# Patient Record
Sex: Female | Born: 1955 | Race: White | Hispanic: No | Marital: Married | Smoking: Never smoker
Health system: Southern US, Community
[De-identification: ages and names within clinical notes are randomized; demographics above are authoritative.]

## PROBLEM LIST (undated history)

## (undated) DIAGNOSIS — E785 Hyperlipidemia, unspecified: Secondary | ICD-10-CM

---

## 1999-08-04 ENCOUNTER — Ambulatory Visit (HOSPITAL_COMMUNITY): Admission: RE | Admit: 1999-08-04 | Discharge: 1999-08-04 | Payer: Self-pay | Admitting: Obstetrics and Gynecology

## 1999-08-04 ENCOUNTER — Encounter (INDEPENDENT_AMBULATORY_CARE_PROVIDER_SITE_OTHER): Payer: Self-pay

## 2009-02-17 ENCOUNTER — Other Ambulatory Visit: Admission: RE | Admit: 2009-02-17 | Discharge: 2009-02-17 | Payer: Self-pay | Admitting: Family Medicine

## 2009-02-25 ENCOUNTER — Encounter: Admission: RE | Admit: 2009-02-25 | Discharge: 2009-02-25 | Payer: Self-pay | Admitting: Family Medicine

## 2010-03-08 ENCOUNTER — Other Ambulatory Visit: Admission: RE | Admit: 2010-03-08 | Discharge: 2010-03-08 | Payer: Self-pay | Admitting: Family Medicine

## 2010-03-24 ENCOUNTER — Encounter: Admission: RE | Admit: 2010-03-24 | Discharge: 2010-03-24 | Payer: Self-pay | Admitting: Family Medicine

## 2011-01-06 NOTE — Op Note (Signed)
[facility]  Patient:    Mary Patel                       MRN: [id number] Proc. Date: 08/04/99 Adm. Date:  [id number] Attending:  Lendon Colonel                           Operative Report  PREOPERATIVE DIAGNOSIS:       Persistent abnormal uterine bleeding.  POSTOPERATIVE DIAGNOSIS:      Persistent abnormal uterine bleeding.  OPERATION:                    Hysteroscopy with resection.  SURGEON:                      Katherine Roan, M.D.  ANESTHESIA:  DESCRIPTION OF PROCEDURE:     The patient was placed in the lithotomy position.  The osmotic dilator was removed from the vagina.  The cervix was dilated quite easily and the hysteroscope was inserted.  Visualization of the pelvis and visualization of the inside of the uterine cavity revealed a fairly smooth uterine cavity with an irregularity posteriorly.  The entire endometrial cavity was resected with a resectoscope and all of the resected material was sent to the lab for study.                                The patient tolerated this procedure well and was  went to the recovery room in good condition. DD:  08/04/99 TD:  08/05/99 Job: [id number]

## 2011-03-28 ENCOUNTER — Other Ambulatory Visit: Payer: Self-pay | Admitting: Family Medicine

## 2011-03-28 DIAGNOSIS — Z1231 Encounter for screening mammogram for malignant neoplasm of breast: Secondary | ICD-10-CM

## 2011-04-04 ENCOUNTER — Ambulatory Visit
Admission: RE | Admit: 2011-04-04 | Discharge: 2011-04-04 | Disposition: A | Discharge status: Home / Self Care | Payer: BC Managed Care – PPO | Source: Ambulatory Visit | Attending: Family Medicine | Admitting: Family Medicine

## 2011-04-04 DIAGNOSIS — Z1231 Encounter for screening mammogram for malignant neoplasm of breast: Secondary | ICD-10-CM

## 2011-04-06 ENCOUNTER — Encounter: Payer: Self-pay | Admitting: Sports Medicine

## 2011-04-06 ENCOUNTER — Ambulatory Visit (INDEPENDENT_AMBULATORY_CARE_PROVIDER_SITE_OTHER): Payer: BC Managed Care – PPO | Admitting: Sports Medicine

## 2011-04-06 VITALS — BP 123/81 | HR 85 | Ht 67.0 in | Wt 180.0 lb

## 2011-04-06 DIAGNOSIS — R269 Unspecified abnormalities of gait and mobility: Secondary | ICD-10-CM

## 2011-04-06 DIAGNOSIS — IMO0002 Reserved for concepts with insufficient information to code with codable children: Secondary | ICD-10-CM

## 2011-04-06 DIAGNOSIS — M179 Osteoarthritis of knee, unspecified: Secondary | ICD-10-CM | POA: Insufficient documentation

## 2011-04-06 DIAGNOSIS — M25569 Pain in unspecified knee: Secondary | ICD-10-CM

## 2011-04-06 DIAGNOSIS — M1710 Unilateral primary osteoarthritis, unspecified knee: Secondary | ICD-10-CM

## 2011-04-06 NOTE — Assessment & Plan Note (Signed)
She is doing a good exercise program at this time and I encouraged her to continue that.  Biking is particularly helpful and she will do this as weather allows.  As she finishes the water aerobics class she is going to look for a little but she can use for the winter to keep this activity as well. Note that her strep testing was actually quite good so I think she is making good progress.  She will return to Dr. Clelia Croft and I will see her as needed.

## 2011-04-06 NOTE — Progress Notes (Signed)
  Subjective:    Patient ID: Mary Patel, female    DOB: 1956-04-07, 55 y.o.   MRN: [id number]  HPI Very pleasant lady sent for my evaluation courtesy of Dr. Philipp Deputy  This patient had had several visits to Dr. Bard Herbert for evaluation of left knee pain. Standing x-rays of both knees were unremarkable. An MRI of her left knee done in a show some mild arthritis in 3 compartments but nothing significant. The patient has tried to make significant lifestyle changes over the past couple of years. Since going through menopause she has been on a lot more weight and then noticed those more swelling of both legs and more left knee pain. Recently she was placed on etodolac which has done a nice job of relieving her knee pain and also does not bother her stomach. She is generally doing better having completed the class of water aerobics. She was able to go to hanging rod and height both the upper and lower waterfalls which have a lot of steep steps and she did not get significant knee swelling after that.  Dr. Clelia Croft felt she might benefit from some evaluation of her gait as the patient has always tended to pronate while walking   Review of Systems     Objective:   Physical Exam Pleasant and in no acute distress  RT Knee: Normal to inspection with no erythema or effusion or obvious bony abnormalities. Palpation normal with no warmth or joint line tenderness or patellar tenderness or condyle tenderness. ROM normal in flexion and extension and lower leg rotation. Ligaments with solid consistent endpoints including ACL, PCL, LCL, MCL. Negative Mcmurray's and provocative meniscal tests. Non painful patellar compression. But mild crepitation Patellar and quadriceps tendons unremarkable. Hamstring and quadriceps strength is normal.  LT knee Normal to inspection with no erythema or effusion or obvious bony abnormalities. Palpation normal with no warmth or joint line tenderness or patellar tenderness  or condyle tenderness. ROM normal in flexion and extension and lower leg rotation. Ligaments with solid consistent endpoints including ACL, PCL, LCL, MCL. Negative Mcmurray's and provocative meniscal tests. Mildy painful patellar compression and moderate creptiation Patellar and quadriceps tendons unremarkable. Hamstring and quadriceps strength is normal.  Staining shows loss of her longitudinal arch bilaterally Walking shows that she gets dynamic genu valgus bilaterally  Her walking was significantly improved with the addition of sports insoles and arch pads  She does have a number of superficial varicosities and generalized swelling of both legs that may represent some venous insufficiency. There was no knee effusion associated with this.         Assessment & Plan:

## 2011-04-06 NOTE — Assessment & Plan Note (Signed)
Continue to correct this by using sports insoles with arch pads  If these do not give him enough support we can always decide to do custom orthotics at a later date

## 2011-04-06 NOTE — Assessment & Plan Note (Signed)
This is well controlled with her current medication and did not suggest making any change

## 2011-04-17 IMAGING — MG MM DIGITAL SCREENING
4 series · 4 of 4 positions shown · non-contrast
Comparison: none

DG SCREEN MAMMOGRAM BILATERAL
Bilateral CC and MLO view(s) were taken.
Technologist: John Mckay Montesin

DIGITAL SCREENING MAMMOGRAM WITH CAD:
The breast tissue is heterogeneously dense.  No masses or malignant type calcifications are 
identified.  Compared with prior studies.
Images were processed with CAD.

[R CC]
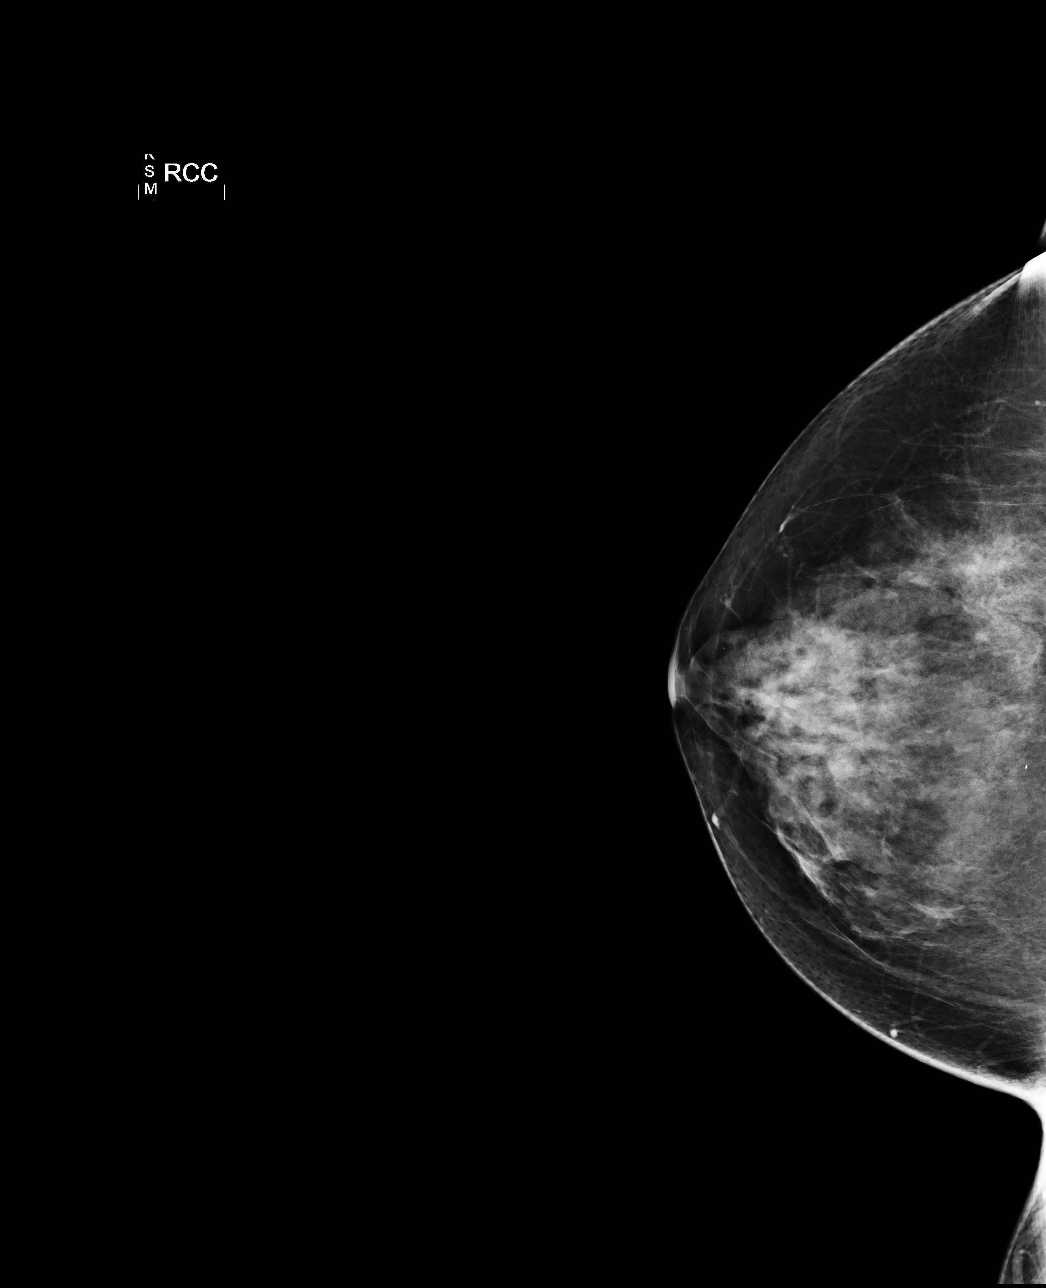

[L CC]
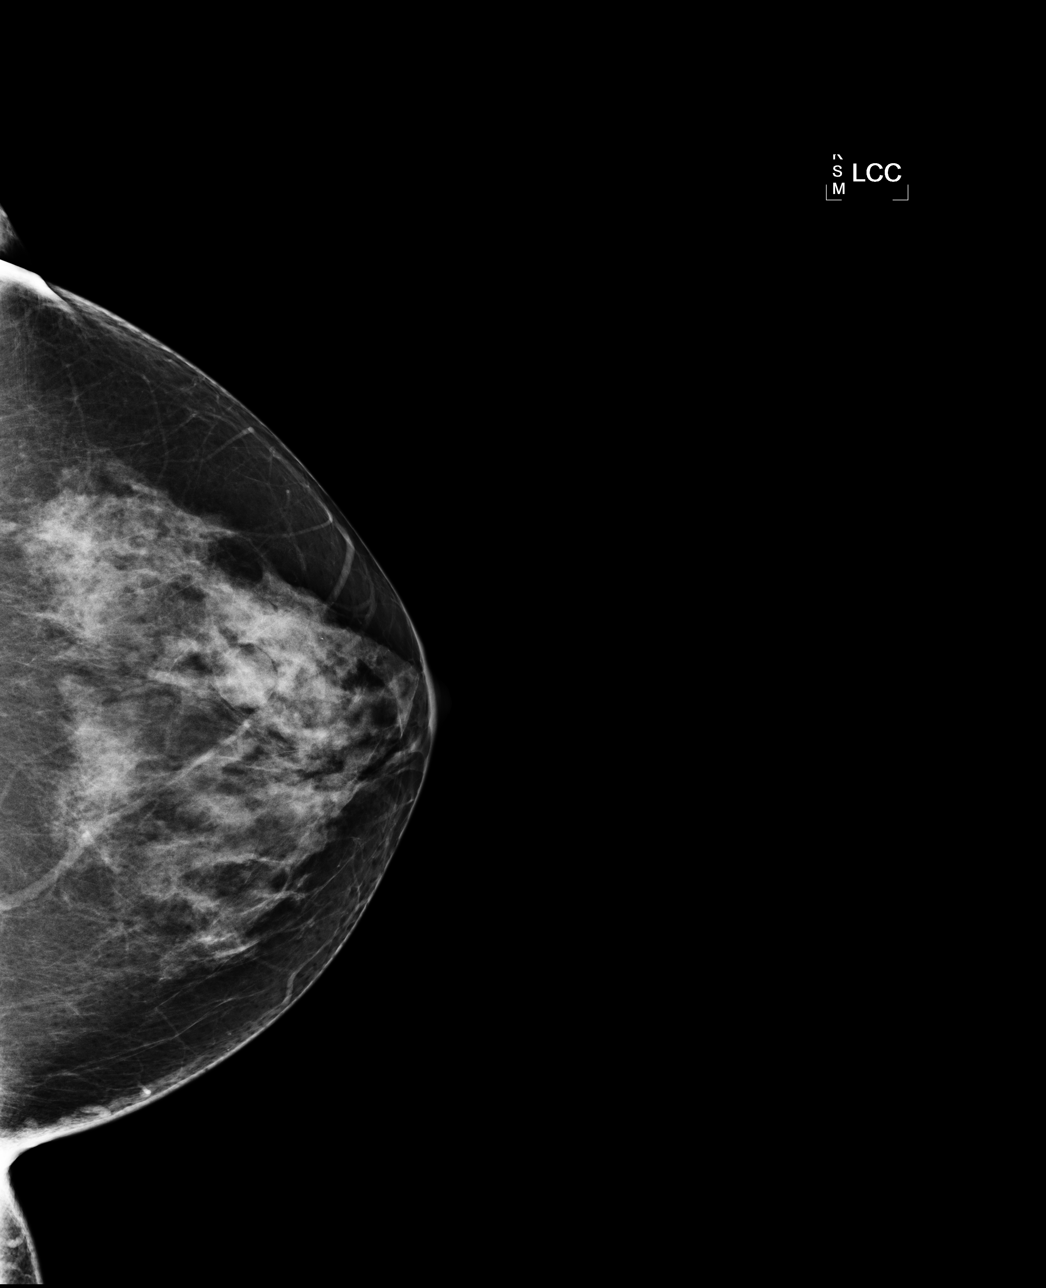

[L MLO]
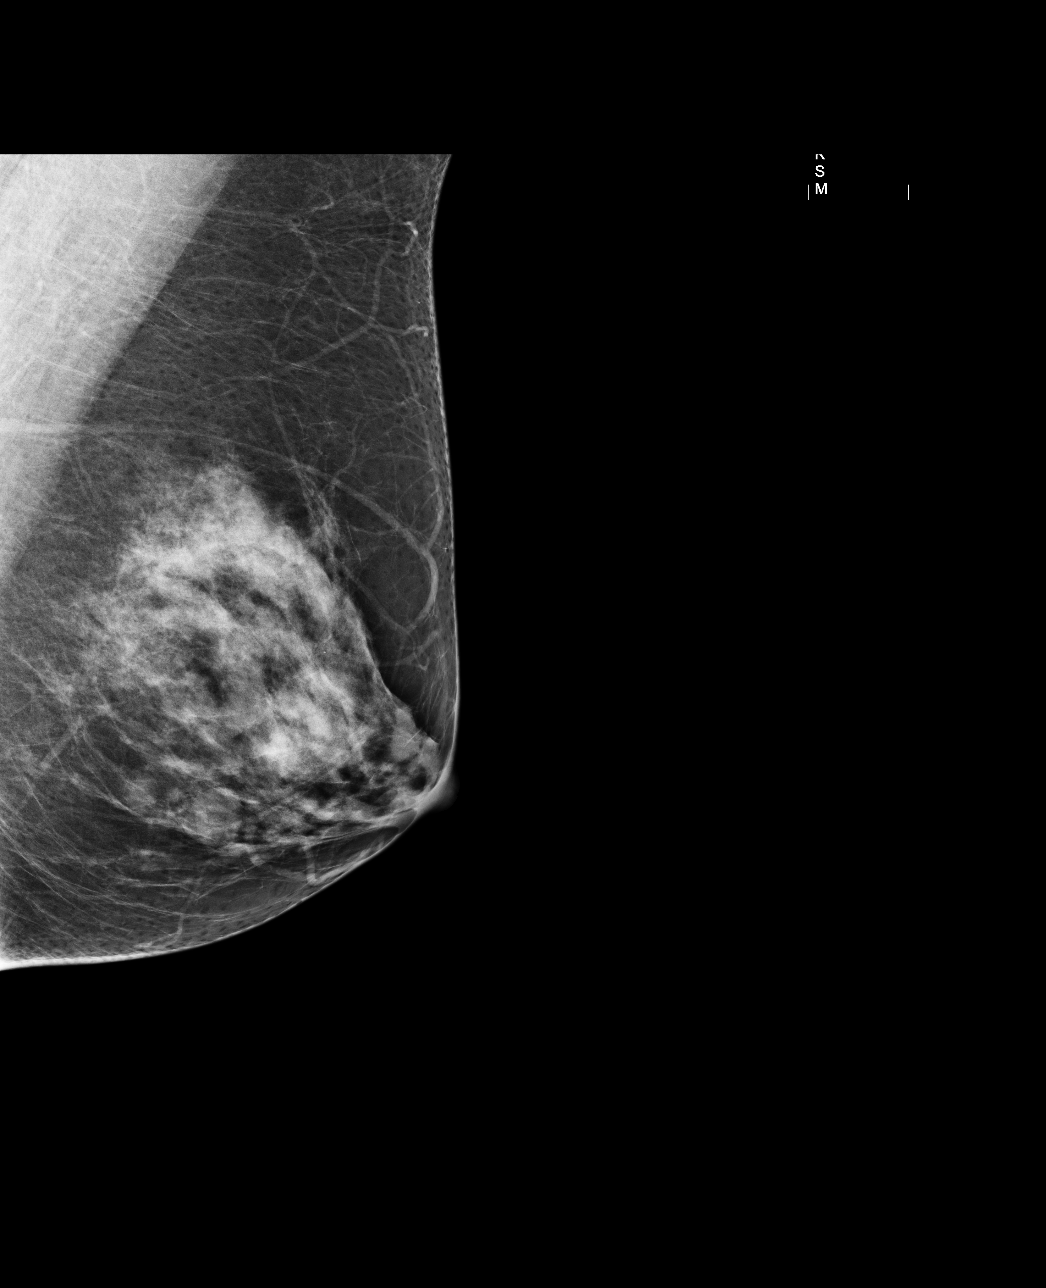

[R MLO]
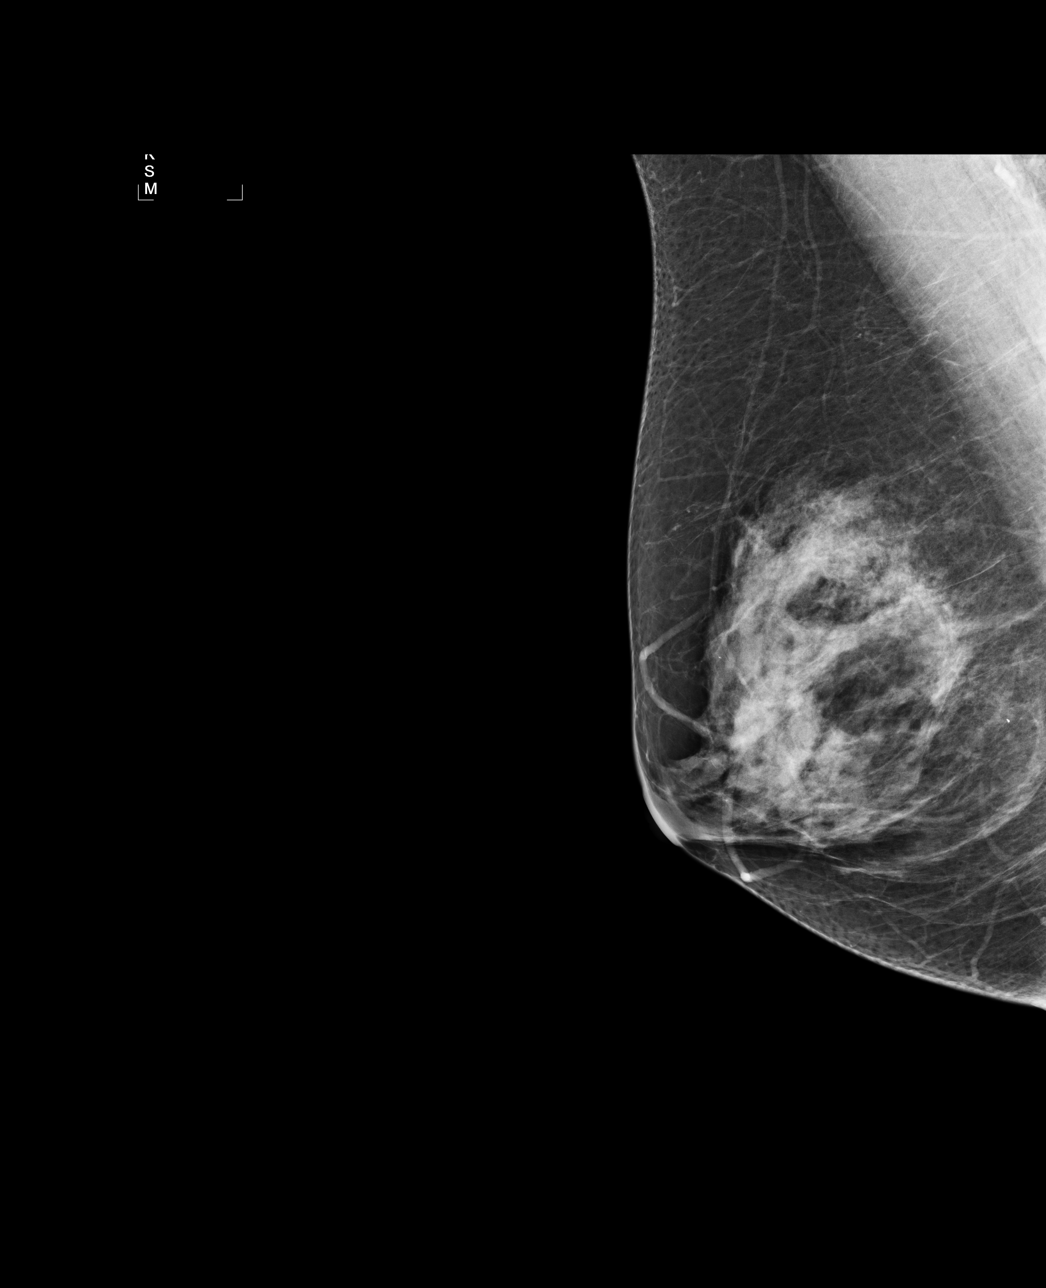

[4 of 4 positions shown; findings below may reference images not displayed]

IMPRESSION: No specific mammographic evidence of malignancy.  Next screening mammogram is recommended in one 
year.

A result letter of this screening mammogram will be mailed directly to the patient.

ASSESSMENT: Negative - BI-RADS 1

Screening mammogram in 1 year.
,

## 2012-04-25 ENCOUNTER — Other Ambulatory Visit: Payer: Self-pay | Admitting: Family Medicine

## 2012-04-25 DIAGNOSIS — Z1231 Encounter for screening mammogram for malignant neoplasm of breast: Secondary | ICD-10-CM

## 2012-05-08 ENCOUNTER — Ambulatory Visit
Admission: RE | Admit: 2012-05-08 | Discharge: 2012-05-08 | Disposition: A | Discharge status: Home / Self Care | Payer: BC Managed Care – PPO | Source: Ambulatory Visit | Attending: Family Medicine | Admitting: Family Medicine

## 2012-05-08 DIAGNOSIS — Z1231 Encounter for screening mammogram for malignant neoplasm of breast: Secondary | ICD-10-CM

## 2013-05-21 ENCOUNTER — Other Ambulatory Visit: Payer: Self-pay

## 2013-05-21 DIAGNOSIS — Z1231 Encounter for screening mammogram for malignant neoplasm of breast: Secondary | ICD-10-CM

## 2013-06-04 ENCOUNTER — Ambulatory Visit
Admission: RE | Admit: 2013-06-04 | Discharge: 2013-06-04 | Disposition: A | Discharge status: Home / Self Care | Payer: BC Managed Care – PPO | Source: Ambulatory Visit

## 2013-06-04 DIAGNOSIS — Z1231 Encounter for screening mammogram for malignant neoplasm of breast: Secondary | ICD-10-CM

## 2013-09-10 ENCOUNTER — Other Ambulatory Visit: Payer: Self-pay | Admitting: Family Medicine

## 2013-09-10 ENCOUNTER — Other Ambulatory Visit (HOSPITAL_COMMUNITY)
Admission: RE | Admit: 2013-09-10 | Discharge: 2013-09-10 | Disposition: A | Discharge status: Home / Self Care | Payer: BC Managed Care – PPO | Source: Ambulatory Visit | Attending: Family Medicine | Admitting: Family Medicine

## 2013-09-10 DIAGNOSIS — Z124 Encounter for screening for malignant neoplasm of cervix: Secondary | ICD-10-CM | POA: Insufficient documentation

## 2013-09-10 DIAGNOSIS — Z1151 Encounter for screening for human papillomavirus (HPV): Secondary | ICD-10-CM | POA: Insufficient documentation

## 2014-05-13 ENCOUNTER — Other Ambulatory Visit: Payer: Self-pay

## 2014-05-13 DIAGNOSIS — Z1231 Encounter for screening mammogram for malignant neoplasm of breast: Secondary | ICD-10-CM

## 2014-06-05 ENCOUNTER — Ambulatory Visit: Payer: BC Managed Care – PPO

## 2014-06-09 ENCOUNTER — Encounter (INDEPENDENT_AMBULATORY_CARE_PROVIDER_SITE_OTHER): Payer: Self-pay

## 2014-06-09 ENCOUNTER — Ambulatory Visit
Admission: RE | Admit: 2014-06-09 | Discharge: 2014-06-09 | Disposition: A | Discharge status: Home / Self Care | Payer: BC Managed Care – PPO | Source: Ambulatory Visit

## 2014-06-09 DIAGNOSIS — Z1231 Encounter for screening mammogram for malignant neoplasm of breast: Secondary | ICD-10-CM

## 2015-06-22 ENCOUNTER — Other Ambulatory Visit: Payer: Self-pay

## 2015-06-22 DIAGNOSIS — Z1231 Encounter for screening mammogram for malignant neoplasm of breast: Secondary | ICD-10-CM

## 2015-07-07 ENCOUNTER — Ambulatory Visit
Admission: RE | Admit: 2015-07-07 | Discharge: 2015-07-07 | Disposition: A | Discharge status: Home / Self Care | Payer: BLUE CROSS/BLUE SHIELD | Source: Ambulatory Visit

## 2015-07-07 DIAGNOSIS — Z1231 Encounter for screening mammogram for malignant neoplasm of breast: Secondary | ICD-10-CM

## 2015-08-29 ENCOUNTER — Encounter (HOSPITAL_COMMUNITY): Payer: Self-pay | Admitting: *Deleted

## 2015-08-29 ENCOUNTER — Emergency Department (HOSPITAL_COMMUNITY)
Admission: EM | Admit: 2015-08-29 | Discharge: 2015-08-29 | Disposition: A | Discharge status: Home / Self Care | Payer: BLUE CROSS/BLUE SHIELD | Source: Home / Self Care | Attending: Emergency Medicine | Admitting: Emergency Medicine

## 2015-08-29 ENCOUNTER — Ambulatory Visit (HOSPITAL_COMMUNITY): Payer: BLUE CROSS/BLUE SHIELD | Attending: Emergency Medicine

## 2015-08-29 DIAGNOSIS — S52615A Nondisplaced fracture of left ulna styloid process, initial encounter for closed fracture: Secondary | ICD-10-CM | POA: Diagnosis not present

## 2015-08-29 DIAGNOSIS — S5292XA Unspecified fracture of left forearm, initial encounter for closed fracture: Secondary | ICD-10-CM | POA: Diagnosis not present

## 2015-08-29 DIAGNOSIS — S62102A Fracture of unspecified carpal bone, left wrist, initial encounter for closed fracture: Secondary | ICD-10-CM

## 2015-08-29 DIAGNOSIS — W000XXA Fall on same level due to ice and snow, initial encounter: Secondary | ICD-10-CM | POA: Diagnosis not present

## 2015-08-29 DIAGNOSIS — M25539 Pain in unspecified wrist: Secondary | ICD-10-CM | POA: Diagnosis present

## 2015-08-29 HISTORY — DX: Hyperlipidemia, unspecified: E78.5

## 2015-08-29 NOTE — Discharge Instructions (Signed)
Please leave the splint on until you see Dr. Grandville Silos. Apply ice over the splint several times a day to help with swelling. You can take ibuprofen 600 mg every 6 hours as needed for pain. Dr. Biagio Borg office should call you tomorrow or Tuesday to set up an appointment.

## 2015-08-29 NOTE — ED Notes (Signed)
Reports slipping on ice this AM, causing left wrist injury.  Swelling noted to wrist with a knot; c/o numbness to all LUE fingers.  Fingers warm, pink, with prompt cap refill and + movement.  Has been applying ice.

## 2015-08-29 NOTE — ED Notes (Signed)
Splint applied per Dr. Bridgett Larsson.

## 2015-08-29 NOTE — ED Notes (Signed)
Pt transported to XR dept

## 2015-08-29 NOTE — ED Provider Notes (Signed)
CSN: [id number]     Arrival date & time 08/29/15  1355 History   First MD Initiated Contact with Patient 08/29/15 1455     Chief Complaint  Patient presents with  . Wrist Injury   (Consider location/radiation/quality/duration/timing/severity/associated sxs/prior Treatment) HPI She is a 60-year-old woman here for evaluation of left wrist injury. She states she slipped on some ice around noon today and fell, landing on her left hand. She reports pain and swelling in the left wrist. She has taken some ibuprofen and applied ice. She does report some numbness and tingling in her hand and fingers. She is able to wiggle her fingers.  Past Medical History  Diagnosis Date  . Hyperlipidemia    History reviewed. No pertinent past surgical history. No family history on file. Social History  Substance Use Topics  . Smoking status: Never Smoker   . Smokeless tobacco: Former Systems developer    Quit date: 04/05/2008  . Alcohol Use: No   OB History    No data available     Review of Systems As in history of present illness Allergies  Penicillins  Home Medications   Prior to Admission medications   Medication Sig Start Date End Date Taking? Authorizing Provider  CALCIUM PO Take by mouth.   Yes Historical Provider, MD  Cholecalciferol (VITAMIN D PO) Take by mouth.   Yes Historical Provider, MD  SIMVASTATIN PO Take by mouth.   Yes Historical Provider, MD   Meds Ordered and Administered this Visit  Medications - No data to display  BP 128/85 mmHg  Pulse 103  Temp(Src) 98.2 F (36.8 C) (Oral)  Resp 18  SpO2 98% No data found.   Physical Exam  Constitutional: She is oriented to person, place, and time. She appears well-developed and well-nourished.  Cardiovascular: Normal rate.   Pulmonary/Chest: Effort normal.  Musculoskeletal:  Left wrist: There is some swelling, particularly at the radial aspect of the wrist. She is tender to palpation circumferentially around the distal wrist. She is able  to wiggle her fingers. Brisk cap refill in all digits. 2+ radial pulse.  Neurological: She is alert and oriented to person, place, and time.    ED Course  Procedures (including critical care time)  Labs Review Labs Reviewed - No data to display  Imaging Review Dg Wrist Complete Left  08/29/2015  CLINICAL DATA:  Status post slip and fall on the ice this morning with a left wrist injury. Pain. Initial encounter. EXAM: LEFT WRIST - COMPLETE 3+ VIEW COMPARISON:  None. FINDINGS: The patient has a mildly impacted fracture through the metaphysis of the distal left radius. There is dorsal angulation of approximately 20 degrees. The fracture does not involve the articular surface of the radius. Nondisplaced ulnar styloid fracture is also identified. First CMC osteoarthritis is present. Soft tissue swelling is present about the wrist. IMPRESSION: Mildly impacted and dorsally angulated distal metaphyseal fracture left radius. Nondisplaced ulnar styloid fracture also seen. Electronically Signed   By: Inge Rise M.D.   On: 08/29/2015 15:28      MDM   1. Left wrist fracture, closed, initial encounter    Spoke with Dr. Grandville Silos, hand specialist on-call. Placed in sugar tong splint and will follow-up with his office later this week. Tylenol or ibuprofen as needed for pain.    Melony Overly, MD 08/29/15 [phone]

## 2016-09-12 ENCOUNTER — Other Ambulatory Visit: Payer: Self-pay | Admitting: Family Medicine

## 2016-09-12 DIAGNOSIS — Z1231 Encounter for screening mammogram for malignant neoplasm of breast: Secondary | ICD-10-CM

## 2016-09-21 ENCOUNTER — Ambulatory Visit
Admission: RE | Admit: 2016-09-21 | Discharge: 2016-09-21 | Disposition: A | Discharge status: Home / Self Care | Payer: BLUE CROSS/BLUE SHIELD | Source: Ambulatory Visit | Attending: Family Medicine | Admitting: Family Medicine

## 2016-09-21 DIAGNOSIS — Z1231 Encounter for screening mammogram for malignant neoplasm of breast: Secondary | ICD-10-CM

## 2017-05-28 ENCOUNTER — Ambulatory Visit (INDEPENDENT_AMBULATORY_CARE_PROVIDER_SITE_OTHER): Payer: BLUE CROSS/BLUE SHIELD | Admitting: Podiatry

## 2017-05-28 ENCOUNTER — Encounter: Payer: Self-pay | Admitting: Podiatry

## 2017-05-28 VITALS — Resp 16

## 2017-05-28 DIAGNOSIS — L600 Ingrowing nail: Secondary | ICD-10-CM | POA: Diagnosis not present

## 2017-05-28 NOTE — Patient Instructions (Signed)

## 2017-05-30 NOTE — Progress Notes (Signed)
Subjective:    Patient ID: Mary Patel, female   DOB: 61 y.o.   MRN: [id number]   HPI patient points the right big toenail states it's getting worse and making it hard for her to wear shoe gear comfortably and it's been increasingly painful with palpation    ROS      Objective:  Physical Exam neurovascular status intact with exquisite discomfort thickness and pain of the right hallux nail where there is damage to the nailbed     Assessment:    Chronic deformed painful right hallux nail     Plan:   Recommended correction and explained procedure to patient and patient wants surgery and today I infiltrated 60 Milligan's like Marcaine mixture remove the hallux nail exposed matrix and applied phenol 5 applications 30 seconds followed by alcohol lavaged sterile dressing. Gave instructions on soaks and reappoint

## 2017-09-12 ENCOUNTER — Other Ambulatory Visit: Payer: Self-pay | Admitting: Family Medicine

## 2017-09-12 DIAGNOSIS — Z1231 Encounter for screening mammogram for malignant neoplasm of breast: Secondary | ICD-10-CM

## 2017-10-02 ENCOUNTER — Ambulatory Visit
Admission: RE | Admit: 2017-10-02 | Discharge: 2017-10-02 | Disposition: A | Discharge status: Home / Self Care | Payer: BLUE CROSS/BLUE SHIELD | Source: Ambulatory Visit | Attending: Family Medicine | Admitting: Family Medicine

## 2017-10-02 DIAGNOSIS — Z1231 Encounter for screening mammogram for malignant neoplasm of breast: Secondary | ICD-10-CM

## 2018-08-29 ENCOUNTER — Other Ambulatory Visit: Payer: Self-pay | Admitting: Internal Medicine

## 2018-08-29 DIAGNOSIS — Z1231 Encounter for screening mammogram for malignant neoplasm of breast: Secondary | ICD-10-CM

## 2018-10-03 ENCOUNTER — Ambulatory Visit
Admission: RE | Admit: 2018-10-03 | Discharge: 2018-10-03 | Disposition: A | Discharge status: Home / Self Care | Payer: BLUE CROSS/BLUE SHIELD | Source: Ambulatory Visit | Attending: Internal Medicine | Admitting: Internal Medicine

## 2018-10-03 DIAGNOSIS — Z1231 Encounter for screening mammogram for malignant neoplasm of breast: Secondary | ICD-10-CM

## 2019-08-27 ENCOUNTER — Ambulatory Visit: Payer: BC Managed Care – PPO | Attending: Internal Medicine

## 2019-08-27 DIAGNOSIS — Z20822 Contact with and (suspected) exposure to covid-19: Secondary | ICD-10-CM

## 2019-08-28 LAB — NOVEL CORONAVIRUS, NAA: SARS-CoV-2, NAA: NOT DETECTED

## 2020-06-30 DIAGNOSIS — D225 Melanocytic nevi of trunk: Secondary | ICD-10-CM | POA: Diagnosis not present

## 2020-06-30 DIAGNOSIS — D2271 Melanocytic nevi of right lower limb, including hip: Secondary | ICD-10-CM | POA: Diagnosis not present

## 2020-06-30 DIAGNOSIS — L820 Inflamed seborrheic keratosis: Secondary | ICD-10-CM | POA: Diagnosis not present

## 2020-06-30 DIAGNOSIS — L918 Other hypertrophic disorders of the skin: Secondary | ICD-10-CM | POA: Diagnosis not present

## 2020-06-30 DIAGNOSIS — D2272 Melanocytic nevi of left lower limb, including hip: Secondary | ICD-10-CM | POA: Diagnosis not present

## 2020-06-30 DIAGNOSIS — L814 Other melanin hyperpigmentation: Secondary | ICD-10-CM | POA: Diagnosis not present

## 2020-06-30 DIAGNOSIS — L920 Granuloma annulare: Secondary | ICD-10-CM | POA: Diagnosis not present

## 2020-06-30 DIAGNOSIS — D1801 Hemangioma of skin and subcutaneous tissue: Secondary | ICD-10-CM | POA: Diagnosis not present

## 2020-06-30 DIAGNOSIS — L821 Other seborrheic keratosis: Secondary | ICD-10-CM | POA: Diagnosis not present

## 2020-08-17 DIAGNOSIS — E785 Hyperlipidemia, unspecified: Secondary | ICD-10-CM | POA: Diagnosis not present

## 2020-08-23 DIAGNOSIS — Z800 Family history of malignant neoplasm of digestive organs: Secondary | ICD-10-CM | POA: Diagnosis not present

## 2020-08-23 DIAGNOSIS — E785 Hyperlipidemia, unspecified: Secondary | ICD-10-CM | POA: Diagnosis not present

## 2020-08-23 DIAGNOSIS — Z87891 Personal history of nicotine dependence: Secondary | ICD-10-CM | POA: Diagnosis not present

## 2020-08-23 DIAGNOSIS — Z0000 Encounter for general adult medical examination without abnormal findings: Secondary | ICD-10-CM | POA: Diagnosis not present

## 2020-08-23 DIAGNOSIS — M25562 Pain in left knee: Secondary | ICD-10-CM | POA: Diagnosis not present

## 2020-08-23 DIAGNOSIS — I10 Essential (primary) hypertension: Secondary | ICD-10-CM | POA: Diagnosis not present

## 2020-08-23 DIAGNOSIS — Z1212 Encounter for screening for malignant neoplasm of rectum: Secondary | ICD-10-CM | POA: Diagnosis not present

## 2020-08-23 DIAGNOSIS — R82998 Other abnormal findings in urine: Secondary | ICD-10-CM | POA: Diagnosis not present

## 2020-08-23 DIAGNOSIS — M25561 Pain in right knee: Secondary | ICD-10-CM | POA: Diagnosis not present

## 2020-09-28 ENCOUNTER — Other Ambulatory Visit: Payer: Self-pay | Admitting: Internal Medicine

## 2020-09-28 DIAGNOSIS — Z1231 Encounter for screening mammogram for malignant neoplasm of breast: Secondary | ICD-10-CM

## 2020-11-18 ENCOUNTER — Other Ambulatory Visit: Payer: Self-pay

## 2020-11-18 ENCOUNTER — Ambulatory Visit
Admission: RE | Admit: 2020-11-18 | Discharge: 2020-11-18 | Disposition: A | Discharge status: Home / Self Care | Payer: Medicare HMO | Source: Ambulatory Visit | Attending: Internal Medicine | Admitting: Internal Medicine

## 2020-11-18 DIAGNOSIS — Z1231 Encounter for screening mammogram for malignant neoplasm of breast: Secondary | ICD-10-CM | POA: Diagnosis not present

## 2020-11-22 DIAGNOSIS — H5213 Myopia, bilateral: Secondary | ICD-10-CM | POA: Diagnosis not present

## 2021-05-05 ENCOUNTER — Encounter: Payer: Self-pay | Admitting: Podiatry

## 2021-05-05 ENCOUNTER — Other Ambulatory Visit: Payer: Self-pay

## 2021-05-05 ENCOUNTER — Ambulatory Visit: Payer: Medicare HMO | Admitting: Podiatry

## 2021-05-05 ENCOUNTER — Ambulatory Visit (INDEPENDENT_AMBULATORY_CARE_PROVIDER_SITE_OTHER): Payer: Medicare HMO

## 2021-05-05 DIAGNOSIS — M722 Plantar fascial fibromatosis: Secondary | ICD-10-CM

## 2021-05-05 MED ORDER — TRIAMCINOLONE ACETONIDE 10 MG/ML IJ SUSP
10.0000 mg | Freq: Once | INTRAMUSCULAR | Status: AC
  Administered 2021-05-05: 10 mg

## 2021-05-05 NOTE — Patient Instructions (Signed)

## 2021-05-05 NOTE — Progress Notes (Signed)
Subjective:   Patient ID: Mary Patel, female   DOB: 65 y.o.   MRN: [id number]   HPI Patient presents stating the bottom of her left heel has become very tender more on the outside of the heel and it is hard for her to walk on.  Its been going on for some time and she feels like she may have been walking differently.  Patient does not smoke likes to be active   Review of Systems  All other systems reviewed and are negative.      Objective:  Physical Exam Vitals and nursing note reviewed.  Constitutional:      Appearance: She is well-developed.  Pulmonary:     Effort: Pulmonary effort is normal.  Musculoskeletal:        General: Normal range of motion.  Skin:    General: Skin is warm.  Neurological:     Mental Status: She is alert.    Neurovascular status found to be intact muscle strength found to be adequate range of motion adequate.  Patient's does have discomfort in the plantar aspect of the left heel and its more in the lateral central band with also mild to moderate pain in the medial band.  Patient has good digital perfusion well oriented x3     Assessment:  Acute Planter fasciitis affecting more the lateral and central band of the plantar fascia with fluid buildup     Plan:  H&P reviewed condition educated her on this and x-rays.  Sterile prep and injected the plantar fascia from a lateral side 3 mg Kenalog 5 mg Xylocaine applied fascial brace to lift up the arch and reappoint to recheck  X-ray indicates small spur no indications of stress fracture or arthritis

## 2021-05-12 ENCOUNTER — Ambulatory Visit: Payer: Medicare HMO | Admitting: Podiatry

## 2021-05-12 ENCOUNTER — Encounter: Payer: Self-pay | Admitting: Podiatry

## 2021-05-12 ENCOUNTER — Other Ambulatory Visit: Payer: Self-pay

## 2021-05-12 DIAGNOSIS — M722 Plantar fascial fibromatosis: Secondary | ICD-10-CM

## 2021-05-12 MED ORDER — TRIAMCINOLONE ACETONIDE 10 MG/ML IJ SUSP
10.0000 mg | Freq: Once | INTRAMUSCULAR | Status: AC
  Administered 2021-05-12: 10 mg

## 2021-05-12 NOTE — Progress Notes (Signed)
Subjective:   Patient ID: Mary Patel, female   DOB: 65 y.o.   MRN: [id number]   HPI Patient presents stating the outside of the tendon feels pretty good on the bottom and I am getting some pain on the inside that I like addressed   ROS      Objective:  Physical Exam  Neurovascular status intact with inflammation of the plantar band medial side fluid buildup extending into the central band     Assessment:  Acute plantar fasciitis still present left     Plan:  Sterile prep injected the left plantar fascia medial side 3 mg Kenalog 5 mg Xylocaine and advised on supportive therapy anti-inflammatories reappoint to recheck

## 2021-09-29 DIAGNOSIS — I10 Essential (primary) hypertension: Secondary | ICD-10-CM | POA: Diagnosis not present

## 2021-09-29 DIAGNOSIS — E785 Hyperlipidemia, unspecified: Secondary | ICD-10-CM | POA: Diagnosis not present

## 2021-10-06 DIAGNOSIS — Z1331 Encounter for screening for depression: Secondary | ICD-10-CM | POA: Diagnosis not present

## 2021-10-06 DIAGNOSIS — Z0000 Encounter for general adult medical examination without abnormal findings: Secondary | ICD-10-CM | POA: Diagnosis not present

## 2021-10-06 DIAGNOSIS — M25562 Pain in left knee: Secondary | ICD-10-CM | POA: Diagnosis not present

## 2021-10-06 DIAGNOSIS — I10 Essential (primary) hypertension: Secondary | ICD-10-CM | POA: Diagnosis not present

## 2021-10-06 DIAGNOSIS — Z1339 Encounter for screening examination for other mental health and behavioral disorders: Secondary | ICD-10-CM | POA: Diagnosis not present

## 2021-10-06 DIAGNOSIS — M25561 Pain in right knee: Secondary | ICD-10-CM | POA: Diagnosis not present

## 2021-10-06 DIAGNOSIS — Z800 Family history of malignant neoplasm of digestive organs: Secondary | ICD-10-CM | POA: Diagnosis not present

## 2021-10-06 DIAGNOSIS — E785 Hyperlipidemia, unspecified: Secondary | ICD-10-CM | POA: Diagnosis not present

## 2021-10-18 DIAGNOSIS — L821 Other seborrheic keratosis: Secondary | ICD-10-CM | POA: Diagnosis not present

## 2021-10-18 DIAGNOSIS — D225 Melanocytic nevi of trunk: Secondary | ICD-10-CM | POA: Diagnosis not present

## 2021-10-18 DIAGNOSIS — D2271 Melanocytic nevi of right lower limb, including hip: Secondary | ICD-10-CM | POA: Diagnosis not present

## 2021-10-18 DIAGNOSIS — L814 Other melanin hyperpigmentation: Secondary | ICD-10-CM | POA: Diagnosis not present

## 2021-10-18 DIAGNOSIS — L918 Other hypertrophic disorders of the skin: Secondary | ICD-10-CM | POA: Diagnosis not present

## 2021-10-18 DIAGNOSIS — D2272 Melanocytic nevi of left lower limb, including hip: Secondary | ICD-10-CM | POA: Diagnosis not present

## 2021-10-18 DIAGNOSIS — D1801 Hemangioma of skin and subcutaneous tissue: Secondary | ICD-10-CM | POA: Diagnosis not present

## 2021-10-18 DIAGNOSIS — L920 Granuloma annulare: Secondary | ICD-10-CM | POA: Diagnosis not present

## 2021-10-20 ENCOUNTER — Other Ambulatory Visit: Payer: Self-pay | Admitting: Internal Medicine

## 2021-10-20 DIAGNOSIS — Z1231 Encounter for screening mammogram for malignant neoplasm of breast: Secondary | ICD-10-CM

## 2021-11-21 ENCOUNTER — Ambulatory Visit
Admission: RE | Admit: 2021-11-21 | Discharge: 2021-11-21 | Disposition: A | Discharge status: Home / Self Care | Payer: Medicare HMO | Source: Ambulatory Visit | Attending: Internal Medicine | Admitting: Internal Medicine

## 2021-11-21 DIAGNOSIS — Z1231 Encounter for screening mammogram for malignant neoplasm of breast: Secondary | ICD-10-CM

## 2021-11-22 ENCOUNTER — Other Ambulatory Visit: Payer: Self-pay | Admitting: Internal Medicine

## 2021-11-22 DIAGNOSIS — R928 Other abnormal and inconclusive findings on diagnostic imaging of breast: Secondary | ICD-10-CM

## 2021-12-07 ENCOUNTER — Ambulatory Visit
Admission: RE | Admit: 2021-12-07 | Discharge: 2021-12-07 | Disposition: A | Discharge status: Home / Self Care | Payer: Medicare HMO | Source: Ambulatory Visit | Attending: Internal Medicine | Admitting: Internal Medicine

## 2021-12-07 ENCOUNTER — Other Ambulatory Visit: Payer: Self-pay | Admitting: Internal Medicine

## 2021-12-07 DIAGNOSIS — N6489 Other specified disorders of breast: Secondary | ICD-10-CM | POA: Diagnosis not present

## 2021-12-07 DIAGNOSIS — R922 Inconclusive mammogram: Secondary | ICD-10-CM | POA: Diagnosis not present

## 2021-12-07 DIAGNOSIS — N6320 Unspecified lump in the left breast, unspecified quadrant: Secondary | ICD-10-CM

## 2021-12-07 DIAGNOSIS — R928 Other abnormal and inconclusive findings on diagnostic imaging of breast: Secondary | ICD-10-CM

## 2021-12-26 DIAGNOSIS — I10 Essential (primary) hypertension: Secondary | ICD-10-CM | POA: Diagnosis not present

## 2021-12-26 DIAGNOSIS — M25562 Pain in left knee: Secondary | ICD-10-CM | POA: Diagnosis not present

## 2021-12-26 DIAGNOSIS — M25561 Pain in right knee: Secondary | ICD-10-CM | POA: Diagnosis not present

## 2022-03-16 ENCOUNTER — Other Ambulatory Visit: Payer: Self-pay | Admitting: Registered Nurse

## 2022-03-16 DIAGNOSIS — N644 Mastodynia: Secondary | ICD-10-CM | POA: Diagnosis not present

## 2022-03-16 DIAGNOSIS — R922 Inconclusive mammogram: Secondary | ICD-10-CM | POA: Diagnosis not present

## 2022-03-28 ENCOUNTER — Other Ambulatory Visit: Payer: Medicare HMO

## 2022-04-04 ENCOUNTER — Other Ambulatory Visit: Payer: Medicare HMO

## 2022-04-17 ENCOUNTER — Ambulatory Visit
Admission: RE | Admit: 2022-04-17 | Discharge: 2022-04-17 | Disposition: A | Discharge status: Home / Self Care | Payer: Medicare HMO | Source: Ambulatory Visit | Attending: Registered Nurse | Admitting: Registered Nurse

## 2022-04-17 ENCOUNTER — Other Ambulatory Visit: Payer: Self-pay | Admitting: Registered Nurse

## 2022-04-17 DIAGNOSIS — N6489 Other specified disorders of breast: Secondary | ICD-10-CM

## 2022-04-17 DIAGNOSIS — N644 Mastodynia: Secondary | ICD-10-CM

## 2022-05-17 DIAGNOSIS — M1712 Unilateral primary osteoarthritis, left knee: Secondary | ICD-10-CM | POA: Diagnosis not present

## 2022-05-17 DIAGNOSIS — M25562 Pain in left knee: Secondary | ICD-10-CM | POA: Diagnosis not present

## 2022-05-17 DIAGNOSIS — M25561 Pain in right knee: Secondary | ICD-10-CM | POA: Diagnosis not present

## 2022-05-22 DIAGNOSIS — H5213 Myopia, bilateral: Secondary | ICD-10-CM | POA: Diagnosis not present

## 2022-09-12 DIAGNOSIS — H524 Presbyopia: Secondary | ICD-10-CM | POA: Diagnosis not present

## 2022-11-07 DIAGNOSIS — E785 Hyperlipidemia, unspecified: Secondary | ICD-10-CM | POA: Diagnosis not present

## 2022-11-07 DIAGNOSIS — M8580 Other specified disorders of bone density and structure, unspecified site: Secondary | ICD-10-CM | POA: Diagnosis not present

## 2022-11-07 DIAGNOSIS — I10 Essential (primary) hypertension: Secondary | ICD-10-CM | POA: Diagnosis not present

## 2022-11-09 DIAGNOSIS — Z1212 Encounter for screening for malignant neoplasm of rectum: Secondary | ICD-10-CM | POA: Diagnosis not present

## 2022-11-14 DIAGNOSIS — E785 Hyperlipidemia, unspecified: Secondary | ICD-10-CM | POA: Diagnosis not present

## 2022-11-14 DIAGNOSIS — M25562 Pain in left knee: Secondary | ICD-10-CM | POA: Diagnosis not present

## 2022-11-14 DIAGNOSIS — Z0000 Encounter for general adult medical examination without abnormal findings: Secondary | ICD-10-CM | POA: Diagnosis not present

## 2022-11-14 DIAGNOSIS — Z1331 Encounter for screening for depression: Secondary | ICD-10-CM | POA: Diagnosis not present

## 2022-11-14 DIAGNOSIS — R82998 Other abnormal findings in urine: Secondary | ICD-10-CM | POA: Diagnosis not present

## 2022-11-14 DIAGNOSIS — I10 Essential (primary) hypertension: Secondary | ICD-10-CM | POA: Diagnosis not present

## 2022-11-14 DIAGNOSIS — I739 Peripheral vascular disease, unspecified: Secondary | ICD-10-CM | POA: Diagnosis not present

## 2022-11-14 DIAGNOSIS — Z1339 Encounter for screening examination for other mental health and behavioral disorders: Secondary | ICD-10-CM | POA: Diagnosis not present

## 2022-11-14 DIAGNOSIS — E559 Vitamin D deficiency, unspecified: Secondary | ICD-10-CM | POA: Diagnosis not present

## 2022-11-14 DIAGNOSIS — F5101 Primary insomnia: Secondary | ICD-10-CM | POA: Diagnosis not present

## 2022-11-14 DIAGNOSIS — M25561 Pain in right knee: Secondary | ICD-10-CM | POA: Diagnosis not present

## 2022-11-29 DIAGNOSIS — D1801 Hemangioma of skin and subcutaneous tissue: Secondary | ICD-10-CM | POA: Diagnosis not present

## 2022-11-29 DIAGNOSIS — L920 Granuloma annulare: Secondary | ICD-10-CM | POA: Diagnosis not present

## 2022-11-29 DIAGNOSIS — B078 Other viral warts: Secondary | ICD-10-CM | POA: Diagnosis not present

## 2022-11-29 DIAGNOSIS — L820 Inflamed seborrheic keratosis: Secondary | ICD-10-CM | POA: Diagnosis not present

## 2022-11-29 DIAGNOSIS — L814 Other melanin hyperpigmentation: Secondary | ICD-10-CM | POA: Diagnosis not present

## 2022-11-29 DIAGNOSIS — L918 Other hypertrophic disorders of the skin: Secondary | ICD-10-CM | POA: Diagnosis not present

## 2022-11-29 DIAGNOSIS — L821 Other seborrheic keratosis: Secondary | ICD-10-CM | POA: Diagnosis not present

## 2022-12-04 ENCOUNTER — Other Ambulatory Visit: Payer: Self-pay

## 2022-12-04 ENCOUNTER — Other Ambulatory Visit: Payer: Self-pay | Admitting: Registered Nurse

## 2022-12-04 ENCOUNTER — Ambulatory Visit
Admission: RE | Admit: 2022-12-04 | Discharge: 2022-12-04 | Disposition: A | Discharge status: Home / Self Care | Payer: Medicare HMO | Source: Ambulatory Visit | Attending: Registered Nurse | Admitting: Registered Nurse

## 2022-12-04 DIAGNOSIS — N6489 Other specified disorders of breast: Secondary | ICD-10-CM

## 2022-12-04 DIAGNOSIS — R599 Enlarged lymph nodes, unspecified: Secondary | ICD-10-CM

## 2022-12-07 DIAGNOSIS — G4710 Hypersomnia, unspecified: Secondary | ICD-10-CM | POA: Diagnosis not present

## 2022-12-08 DIAGNOSIS — G4710 Hypersomnia, unspecified: Secondary | ICD-10-CM | POA: Diagnosis not present

## 2022-12-14 DIAGNOSIS — G4733 Obstructive sleep apnea (adult) (pediatric): Secondary | ICD-10-CM | POA: Diagnosis not present

## 2022-12-20 ENCOUNTER — Ambulatory Visit
Admission: RE | Admit: 2022-12-20 | Discharge: 2022-12-20 | Disposition: A | Discharge status: Home / Self Care | Payer: Medicare HMO | Source: Ambulatory Visit | Attending: Registered Nurse | Admitting: Registered Nurse

## 2022-12-20 DIAGNOSIS — N6489 Other specified disorders of breast: Secondary | ICD-10-CM

## 2022-12-20 DIAGNOSIS — R599 Enlarged lymph nodes, unspecified: Secondary | ICD-10-CM

## 2022-12-20 DIAGNOSIS — R590 Localized enlarged lymph nodes: Secondary | ICD-10-CM | POA: Diagnosis not present

## 2022-12-20 DIAGNOSIS — R922 Inconclusive mammogram: Secondary | ICD-10-CM | POA: Diagnosis not present

## 2022-12-20 DIAGNOSIS — N6012 Diffuse cystic mastopathy of left breast: Secondary | ICD-10-CM | POA: Diagnosis not present

## 2022-12-20 HISTORY — PX: BREAST BIOPSY: SHX20

## 2022-12-28 DIAGNOSIS — G4733 Obstructive sleep apnea (adult) (pediatric): Secondary | ICD-10-CM | POA: Diagnosis not present

## 2023-01-05 DIAGNOSIS — M1712 Unilateral primary osteoarthritis, left knee: Secondary | ICD-10-CM | POA: Diagnosis not present

## 2023-01-25 DIAGNOSIS — R5383 Other fatigue: Secondary | ICD-10-CM | POA: Diagnosis not present

## 2023-01-25 DIAGNOSIS — R5381 Other malaise: Secondary | ICD-10-CM | POA: Diagnosis not present

## 2023-01-25 DIAGNOSIS — B349 Viral infection, unspecified: Secondary | ICD-10-CM | POA: Diagnosis not present

## 2023-01-25 DIAGNOSIS — R509 Fever, unspecified: Secondary | ICD-10-CM | POA: Diagnosis not present

## 2023-01-26 ENCOUNTER — Emergency Department
Admission: EM | Admit: 2023-01-26 | Discharge: 2023-01-26 | Disposition: A | Discharge status: Home / Self Care | Payer: Medicare HMO | Attending: Emergency Medicine | Admitting: Emergency Medicine

## 2023-01-26 ENCOUNTER — Other Ambulatory Visit: Payer: Self-pay

## 2023-01-26 ENCOUNTER — Emergency Department: Payer: Medicare HMO

## 2023-01-26 DIAGNOSIS — J90 Pleural effusion, not elsewhere classified: Secondary | ICD-10-CM | POA: Diagnosis not present

## 2023-01-26 DIAGNOSIS — J189 Pneumonia, unspecified organism: Secondary | ICD-10-CM | POA: Diagnosis not present

## 2023-01-26 DIAGNOSIS — R590 Localized enlarged lymph nodes: Secondary | ICD-10-CM | POA: Diagnosis not present

## 2023-01-26 DIAGNOSIS — Z20822 Contact with and (suspected) exposure to covid-19: Secondary | ICD-10-CM | POA: Diagnosis not present

## 2023-01-26 DIAGNOSIS — R509 Fever, unspecified: Secondary | ICD-10-CM | POA: Diagnosis not present

## 2023-01-26 LAB — URINALYSIS, ROUTINE W REFLEX MICROSCOPIC
Bacteria, UA: NONE SEEN
Bilirubin Urine: NEGATIVE
Glucose, UA: 150 mg/dL — AB
Ketones, ur: NEGATIVE mg/dL
Leukocytes,Ua: NEGATIVE
Nitrite: NEGATIVE
Protein, ur: 30 mg/dL — AB
Specific Gravity, Urine: 1.02 (ref 1.005–1.030)
pH: 5 (ref 5.0–8.0)

## 2023-01-26 LAB — CBC
HCT: 38.7 % (ref 36.0–46.0)
Hemoglobin: 12.4 g/dL (ref 12.0–15.0)
MCH: 29.2 pg (ref 26.0–34.0)
MCHC: 32 g/dL (ref 30.0–36.0)
MCV: 91.1 fL (ref 80.0–100.0)
Platelets: 154 10*3/uL (ref 150–400)
RBC: 4.25 MIL/uL (ref 3.87–5.11)
RDW: 17.3 % — ABNORMAL HIGH (ref 11.5–15.5)
WBC: 9 10*3/uL (ref 4.0–10.5)
nRBC: 0 % (ref 0.0–0.2)

## 2023-01-26 LAB — BASIC METABOLIC PANEL
Anion gap: 12 (ref 5–15)
BUN: 17 mg/dL (ref 8–23)
CO2: 18 mmol/L — ABNORMAL LOW (ref 22–32)
Calcium: 9.1 mg/dL (ref 8.9–10.3)
Chloride: 104 mmol/L (ref 98–111)
Creatinine, Ser: 0.86 mg/dL (ref 0.44–1.00)
GFR, Estimated: >60 mL/min (ref >60)
Glucose, Bld: 115 mg/dL — ABNORMAL HIGH (ref 70–99)
Potassium: 3.5 mmol/L (ref 3.5–5.1)
Sodium: 134 mmol/L — ABNORMAL LOW (ref 135–145)

## 2023-01-26 LAB — SARS CORONAVIRUS 2 BY RT PCR: SARS Coronavirus 2 by RT PCR: NEGATIVE

## 2023-01-26 LAB — GROUP A STREP BY PCR: Group A Strep by PCR: NOT DETECTED

## 2023-01-26 MED ORDER — AZITHROMYCIN 500 MG PO TABS
500.0000 mg | ORAL_TABLET | Freq: Once | ORAL | Status: AC
  Administered 2023-01-26: 500 mg via ORAL
  Filled 2023-01-26: qty 1

## 2023-01-26 MED ORDER — AZITHROMYCIN 250 MG PO TABS: ORAL_TABLET | ORAL | 0 refills | Status: AC

## 2023-01-26 MED ORDER — CEPHALEXIN 500 MG PO CAPS: 500.0000 mg | ORAL_CAPSULE | Freq: Two times a day (BID) | ORAL | 0 refills | Status: AC

## 2023-01-26 MED ORDER — CEPHALEXIN 500 MG PO CAPS
500.0000 mg | ORAL_CAPSULE | Freq: Once | ORAL | Status: AC
  Administered 2023-01-26: 500 mg via ORAL
  Filled 2023-01-26: qty 1

## 2023-01-26 NOTE — ED Triage Notes (Signed)
Pt ed ED from home for fever. Pt was seen yesterday at a urgent care yesterday and was NEG for flu and COVID. Pt is worried bc she hasn't been urinating as much but she is still making urine. Has urinated twice today. Pt is caox4, in no acute distress, in wheel chair in triage.

## 2023-01-26 NOTE — ED Provider Notes (Signed)
[facility] Provider Note    Event Date/Time   First MD Initiated Contact with Patient 01/26/23 1752     (approximate)  History   Chief Complaint: Fever (X 2 days)  HPI  Mary Patel is a 67 y.o. female with a past medical history of hyperlipidemia who presents to the emergency department for a fever.  According to the patient over the past 3 days she has been experiencing fever as high as 104 per patient.  Besides the fever patient states cough just started today.  Denies any chest pain denies any abdominal pain denies any urinary symptoms.  Denies any nausea vomiting or diarrhea.  Denies any neck pain.  Does states slight headache at times, although denies any currently.  Patient is afebrile in the emergency department 98.4 but states she took Tylenol just before arrival.  Physical Exam   Triage Vital Signs: ED Triage Vitals [01/26/23 1507]  Enc Vitals Group     BP 104/88     Pulse Rate 90     Resp 16     Temp 98.4 F (36.9 C)     Temp Source Oral     SpO2 98 %     Weight 178 lb 9.2 oz (81 kg)     Height 5\' 7"  (1.702 m)     Head Circumference      Peak Flow      Pain Score 0     Pain Loc      Pain Edu?      Excl. in GC?     Most recent vital signs: Vitals:   01/26/23 1507  BP: 104/88  Pulse: 90  Resp: 16  Temp: 98.4 F (36.9 C)  SpO2: 98%    General: Awake, no distress.  Mild pharyngeal erythema, patient states slight sore throat but not significant.  Also has mild anterior cervical lymphadenopathy. CV:  Good peripheral perfusion.  Regular rate and rhythm  Resp:  Normal effort.  Equal breath sounds bilaterally.  Occasional cough during my examination. Abd:  No distention.  Soft, nontender.  No rebound or guarding.  Benign abdomen  ED Results / Procedures / Treatments   RADIOLOGY  I have reviewed and interpreted the chest x-ray images.  No consolidation seen on my evaluation. Radiology is read the x-ray as mild left basilar  atelectasis versus infiltrate.   MEDICATIONS ORDERED IN ED: Medications - No data to display   IMPRESSION / MDM / ASSESSMENT AND PLAN / ED COURSE  I reviewed the triage vital signs and the nursing notes.  Patient's presentation is most consistent with acute presentation with potential threat to life or bodily function.  Patient presents emergency department for 3 days of fever now with cough.  Vital signs are reassuring afebrile currently low the patient states she took Tylenol just prior to arrival.  Respiratory rate is normal and 98% on room air.  Lab work is reassuring with a normal CBC with a normal white blood cell count, reassuring chemistry.  Urinalysis shows normal results no sign of infection.  Chest x-ray however does show mild left basilar atelectasis versus infiltrate.  As the patient has been coughing with fever at home we will cover for pneumonia with Zithromax and Keflex have the patient follow-up with her primary care doctor.  Awaiting COVID and strep swabs.  Patient's COVID and strep are negative.  Will discharge with antibiotics and PCP follow-up.  Patient agreeable to plan.  FINAL CLINICAL IMPRESSION(S) / ED DIAGNOSES  Fever Community-acquired pneumonia  Rx / DC Orders   Zithromax Keflex  Note:  This document was prepared using Dragon voice recognition software and may include unintentional dictation errors.   Minna Antis, MD 01/26/23 2024

## 2023-02-01 ENCOUNTER — Telehealth: Payer: Self-pay | Admitting: *Deleted

## 2023-02-01 NOTE — Telephone Encounter (Signed)
Transition Care Management Unsuccessful Follow-up Telephone Call  Date of discharge and from where:  [facility] 01/26/2023  Attempts:  1st Attempt  Reason for unsuccessful TCM follow-up call:  No answer/busy

## 2023-02-06 DIAGNOSIS — J189 Pneumonia, unspecified organism: Secondary | ICD-10-CM | POA: Diagnosis not present

## 2023-02-06 DIAGNOSIS — R5383 Other fatigue: Secondary | ICD-10-CM | POA: Diagnosis not present

## 2023-02-15 DIAGNOSIS — G4733 Obstructive sleep apnea (adult) (pediatric): Secondary | ICD-10-CM | POA: Diagnosis not present

## 2023-02-16 DIAGNOSIS — G4733 Obstructive sleep apnea (adult) (pediatric): Secondary | ICD-10-CM | POA: Diagnosis not present

## 2023-02-27 DIAGNOSIS — J189 Pneumonia, unspecified organism: Secondary | ICD-10-CM | POA: Diagnosis not present

## 2023-02-27 DIAGNOSIS — I10 Essential (primary) hypertension: Secondary | ICD-10-CM | POA: Diagnosis not present

## 2023-02-27 DIAGNOSIS — Z87891 Personal history of nicotine dependence: Secondary | ICD-10-CM | POA: Diagnosis not present

## 2023-03-14 DIAGNOSIS — G4733 Obstructive sleep apnea (adult) (pediatric): Secondary | ICD-10-CM | POA: Diagnosis not present

## 2023-04-30 DIAGNOSIS — M25562 Pain in left knee: Secondary | ICD-10-CM | POA: Diagnosis not present

## 2023-05-01 DIAGNOSIS — G4733 Obstructive sleep apnea (adult) (pediatric): Secondary | ICD-10-CM | POA: Diagnosis not present

## 2023-05-16 DIAGNOSIS — M25562 Pain in left knee: Secondary | ICD-10-CM | POA: Diagnosis not present

## 2023-05-22 DIAGNOSIS — S83282A Other tear of lateral meniscus, current injury, left knee, initial encounter: Secondary | ICD-10-CM | POA: Diagnosis not present

## 2023-06-01 DIAGNOSIS — M1712 Unilateral primary osteoarthritis, left knee: Secondary | ICD-10-CM | POA: Diagnosis not present

## 2023-06-01 DIAGNOSIS — S83282A Other tear of lateral meniscus, current injury, left knee, initial encounter: Secondary | ICD-10-CM | POA: Diagnosis not present

## 2023-07-16 DIAGNOSIS — M1712 Unilateral primary osteoarthritis, left knee: Secondary | ICD-10-CM | POA: Diagnosis not present

## 2023-07-23 DIAGNOSIS — M1712 Unilateral primary osteoarthritis, left knee: Secondary | ICD-10-CM | POA: Diagnosis not present

## 2023-07-30 DIAGNOSIS — M1712 Unilateral primary osteoarthritis, left knee: Secondary | ICD-10-CM | POA: Diagnosis not present

## 2023-10-31 DIAGNOSIS — H43811 Vitreous degeneration, right eye: Secondary | ICD-10-CM | POA: Diagnosis not present

## 2023-11-02 ENCOUNTER — Other Ambulatory Visit: Payer: Self-pay | Admitting: Family Medicine

## 2023-11-02 DIAGNOSIS — Z1231 Encounter for screening mammogram for malignant neoplasm of breast: Secondary | ICD-10-CM

## 2023-11-19 DIAGNOSIS — I10 Essential (primary) hypertension: Secondary | ICD-10-CM | POA: Diagnosis not present

## 2023-11-19 DIAGNOSIS — E785 Hyperlipidemia, unspecified: Secondary | ICD-10-CM | POA: Diagnosis not present

## 2023-11-19 DIAGNOSIS — E559 Vitamin D deficiency, unspecified: Secondary | ICD-10-CM | POA: Diagnosis not present

## 2023-11-26 DIAGNOSIS — D649 Anemia, unspecified: Secondary | ICD-10-CM | POA: Diagnosis not present

## 2023-12-07 ENCOUNTER — Ambulatory Visit
Admission: RE | Admit: 2023-12-07 | Discharge: 2023-12-07 | Disposition: A | Discharge status: Home / Self Care | Source: Ambulatory Visit | Attending: Family Medicine | Admitting: Family Medicine

## 2023-12-07 DIAGNOSIS — Z1231 Encounter for screening mammogram for malignant neoplasm of breast: Secondary | ICD-10-CM

## 2023-12-11 DIAGNOSIS — Z0000 Encounter for general adult medical examination without abnormal findings: Secondary | ICD-10-CM | POA: Diagnosis not present

## 2023-12-11 DIAGNOSIS — E559 Vitamin D deficiency, unspecified: Secondary | ICD-10-CM | POA: Diagnosis not present

## 2023-12-11 DIAGNOSIS — R9389 Abnormal findings on diagnostic imaging of other specified body structures: Secondary | ICD-10-CM | POA: Diagnosis not present

## 2023-12-11 DIAGNOSIS — Z1339 Encounter for screening examination for other mental health and behavioral disorders: Secondary | ICD-10-CM | POA: Diagnosis not present

## 2023-12-11 DIAGNOSIS — I739 Peripheral vascular disease, unspecified: Secondary | ICD-10-CM | POA: Diagnosis not present

## 2023-12-11 DIAGNOSIS — I10 Essential (primary) hypertension: Secondary | ICD-10-CM | POA: Diagnosis not present

## 2023-12-11 DIAGNOSIS — R82998 Other abnormal findings in urine: Secondary | ICD-10-CM | POA: Diagnosis not present

## 2023-12-11 DIAGNOSIS — D508 Other iron deficiency anemias: Secondary | ICD-10-CM | POA: Diagnosis not present

## 2023-12-11 DIAGNOSIS — E785 Hyperlipidemia, unspecified: Secondary | ICD-10-CM | POA: Diagnosis not present

## 2023-12-11 DIAGNOSIS — Z1331 Encounter for screening for depression: Secondary | ICD-10-CM | POA: Diagnosis not present

## 2023-12-26 DIAGNOSIS — L918 Other hypertrophic disorders of the skin: Secondary | ICD-10-CM | POA: Diagnosis not present

## 2023-12-26 DIAGNOSIS — L821 Other seborrheic keratosis: Secondary | ICD-10-CM | POA: Diagnosis not present

## 2023-12-26 DIAGNOSIS — D2271 Melanocytic nevi of right lower limb, including hip: Secondary | ICD-10-CM | POA: Diagnosis not present

## 2023-12-26 DIAGNOSIS — D2272 Melanocytic nevi of left lower limb, including hip: Secondary | ICD-10-CM | POA: Diagnosis not present

## 2023-12-26 DIAGNOSIS — L813 Cafe au lait spots: Secondary | ICD-10-CM | POA: Diagnosis not present

## 2023-12-26 DIAGNOSIS — S20361A Insect bite (nonvenomous) of right front wall of thorax, initial encounter: Secondary | ICD-10-CM | POA: Diagnosis not present

## 2023-12-26 DIAGNOSIS — D2262 Melanocytic nevi of left upper limb, including shoulder: Secondary | ICD-10-CM | POA: Diagnosis not present

## 2023-12-31 DIAGNOSIS — G4733 Obstructive sleep apnea (adult) (pediatric): Secondary | ICD-10-CM | POA: Diagnosis not present

## 2024-01-15 ENCOUNTER — Telehealth: Payer: Self-pay | Admitting: Gastroenterology

## 2024-01-15 NOTE — Telephone Encounter (Signed)
 Good morning Dr. Brice Campi,  Doc of Day AM   We received a call and an urgent referral for this patient wishing to schedule an appointment for a colonoscopy due to strong family history and her sister passing away from complications at 24 and IDA. Patient last had a colonoscopy in 09/2019 with Dr. Elsie Halo at [facility]. She states she would like to be seen at our practice due to Dr. Sam Creighton retirement. Records were scanned into Media for your review. Would you please advise on scheduling?  Thank you.

## 2024-01-15 NOTE — Telephone Encounter (Signed)
 Patient has been referred for history of iron deficiency anemia (though I cannot see the actual labs) as well as a family history. If we were just doing family history she would not be due for surveillance/screening until 2026. But in the setting of what is reported to be an iron deficiency anemia, she would be indicated an upper and lower endoscopy. She can have the EGD/colonoscopy directly scheduled in the [facility]. If she wants to be seen in clinic first, that is okay as well and she can be scheduled with any of the [facility] C APP's and I can staff. I do ask if we can try to get the blood counts and iron studies for records that would be good as well. Thanks. GM

## 2024-01-22 NOTE — Telephone Encounter (Signed)
 Spoke with patient, states she has scheduled procedure with [facility]. Will continue care with [facility].

## 2024-01-29 DIAGNOSIS — I10 Essential (primary) hypertension: Secondary | ICD-10-CM | POA: Diagnosis not present

## 2024-02-04 DIAGNOSIS — Z800 Family history of malignant neoplasm of digestive organs: Secondary | ICD-10-CM | POA: Diagnosis not present

## 2024-02-04 DIAGNOSIS — D508 Other iron deficiency anemias: Secondary | ICD-10-CM | POA: Diagnosis not present

## 2024-02-04 DIAGNOSIS — I10 Essential (primary) hypertension: Secondary | ICD-10-CM | POA: Diagnosis not present

## 2024-02-26 DIAGNOSIS — K2960 Other gastritis without bleeding: Secondary | ICD-10-CM | POA: Diagnosis not present

## 2024-02-26 DIAGNOSIS — K635 Polyp of colon: Secondary | ICD-10-CM | POA: Diagnosis not present

## 2024-02-26 DIAGNOSIS — D122 Benign neoplasm of ascending colon: Secondary | ICD-10-CM | POA: Diagnosis not present

## 2024-02-26 DIAGNOSIS — K2930 Chronic superficial gastritis without bleeding: Secondary | ICD-10-CM | POA: Diagnosis not present

## 2024-02-26 DIAGNOSIS — K259 Gastric ulcer, unspecified as acute or chronic, without hemorrhage or perforation: Secondary | ICD-10-CM | POA: Diagnosis not present

## 2024-02-26 DIAGNOSIS — D509 Iron deficiency anemia, unspecified: Secondary | ICD-10-CM | POA: Diagnosis not present

## 2024-02-26 DIAGNOSIS — K317 Polyp of stomach and duodenum: Secondary | ICD-10-CM | POA: Diagnosis not present

## 2024-02-26 DIAGNOSIS — K5730 Diverticulosis of large intestine without perforation or abscess without bleeding: Secondary | ICD-10-CM | POA: Diagnosis not present

## 2024-02-26 DIAGNOSIS — K2100 Gastro-esophageal reflux disease with esophagitis, without bleeding: Secondary | ICD-10-CM | POA: Diagnosis not present

## 2024-03-05 DIAGNOSIS — K635 Polyp of colon: Secondary | ICD-10-CM | POA: Diagnosis not present

## 2024-03-05 DIAGNOSIS — K2930 Chronic superficial gastritis without bleeding: Secondary | ICD-10-CM | POA: Diagnosis not present

## 2024-03-05 DIAGNOSIS — D122 Benign neoplasm of ascending colon: Secondary | ICD-10-CM | POA: Diagnosis not present

## 2024-03-05 DIAGNOSIS — K317 Polyp of stomach and duodenum: Secondary | ICD-10-CM | POA: Diagnosis not present

## 2024-04-28 DIAGNOSIS — K317 Polyp of stomach and duodenum: Secondary | ICD-10-CM | POA: Diagnosis not present

## 2024-04-28 DIAGNOSIS — K259 Gastric ulcer, unspecified as acute or chronic, without hemorrhage or perforation: Secondary | ICD-10-CM | POA: Diagnosis not present

## 2024-04-28 DIAGNOSIS — K449 Diaphragmatic hernia without obstruction or gangrene: Secondary | ICD-10-CM | POA: Diagnosis not present

## 2024-06-05 DIAGNOSIS — D649 Anemia, unspecified: Secondary | ICD-10-CM | POA: Diagnosis not present

## 2024-06-05 DIAGNOSIS — G4733 Obstructive sleep apnea (adult) (pediatric): Secondary | ICD-10-CM | POA: Diagnosis not present

## 2024-06-05 DIAGNOSIS — Z23 Encounter for immunization: Secondary | ICD-10-CM | POA: Diagnosis not present

## 2024-06-05 DIAGNOSIS — E785 Hyperlipidemia, unspecified: Secondary | ICD-10-CM | POA: Diagnosis not present

## 2024-06-05 DIAGNOSIS — D508 Other iron deficiency anemias: Secondary | ICD-10-CM | POA: Diagnosis not present

## 2024-06-05 DIAGNOSIS — I10 Essential (primary) hypertension: Secondary | ICD-10-CM | POA: Diagnosis not present

## 2024-07-11 DIAGNOSIS — R600 Localized edema: Secondary | ICD-10-CM | POA: Diagnosis not present

## 2024-07-11 DIAGNOSIS — E785 Hyperlipidemia, unspecified: Secondary | ICD-10-CM | POA: Diagnosis not present

## 2024-07-11 DIAGNOSIS — K219 Gastro-esophageal reflux disease without esophagitis: Secondary | ICD-10-CM | POA: Diagnosis not present

## 2024-07-11 DIAGNOSIS — E669 Obesity, unspecified: Secondary | ICD-10-CM | POA: Diagnosis not present

## 2024-07-11 DIAGNOSIS — Z87891 Personal history of nicotine dependence: Secondary | ICD-10-CM | POA: Diagnosis not present

## 2024-07-11 DIAGNOSIS — Z833 Family history of diabetes mellitus: Secondary | ICD-10-CM | POA: Diagnosis not present

## 2024-07-11 DIAGNOSIS — Z6830 Body mass index (BMI) 30.0-30.9, adult: Secondary | ICD-10-CM | POA: Diagnosis not present

## 2024-07-11 DIAGNOSIS — Z8249 Family history of ischemic heart disease and other diseases of the circulatory system: Secondary | ICD-10-CM | POA: Diagnosis not present

## 2024-07-11 DIAGNOSIS — Z885 Allergy status to narcotic agent status: Secondary | ICD-10-CM | POA: Diagnosis not present

## 2024-07-11 DIAGNOSIS — R32 Unspecified urinary incontinence: Secondary | ICD-10-CM | POA: Diagnosis not present

## 2024-07-11 DIAGNOSIS — K259 Gastric ulcer, unspecified as acute or chronic, without hemorrhage or perforation: Secondary | ICD-10-CM | POA: Diagnosis not present

## 2024-07-11 DIAGNOSIS — F325 Major depressive disorder, single episode, in full remission: Secondary | ICD-10-CM | POA: Diagnosis not present

## 2024-07-11 DIAGNOSIS — M1990 Unspecified osteoarthritis, unspecified site: Secondary | ICD-10-CM | POA: Diagnosis not present

## 2024-07-11 DIAGNOSIS — Z886 Allergy status to analgesic agent status: Secondary | ICD-10-CM | POA: Diagnosis not present

## 2024-07-11 DIAGNOSIS — G4733 Obstructive sleep apnea (adult) (pediatric): Secondary | ICD-10-CM | POA: Diagnosis not present

## 2024-07-11 DIAGNOSIS — M8580 Other specified disorders of bone density and structure, unspecified site: Secondary | ICD-10-CM | POA: Diagnosis not present
